# Patient Record
Sex: Male | Born: 2005 | Race: Black or African American | Hispanic: No | Marital: Single | Smoking: Never smoker
Health system: Southern US, Community
[De-identification: ages and names within clinical notes are randomized; demographics above are authoritative.]

## PROBLEM LIST (undated history)

## (undated) ENCOUNTER — Ambulatory Visit (HOSPITAL_COMMUNITY): Source: Home / Self Care

---
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
## 2023-07-15 ENCOUNTER — Ambulatory Visit (HOSPITAL_COMMUNITY)
Admission: RE | Admit: 2023-07-15 | Discharge: 2023-07-15 | Disposition: A | Discharge status: Home / Self Care | Payer: Medicaid Other | Source: Ambulatory Visit | Attending: Physician Assistant | Admitting: Physician Assistant

## 2023-07-15 ENCOUNTER — Encounter (HOSPITAL_COMMUNITY): Payer: Self-pay

## 2023-07-15 ENCOUNTER — Telehealth (HOSPITAL_COMMUNITY): Payer: Self-pay

## 2023-07-15 ENCOUNTER — Ambulatory Visit (INDEPENDENT_AMBULATORY_CARE_PROVIDER_SITE_OTHER): Payer: Medicaid Other

## 2023-07-15 VITALS — BP 140/73 | HR 58 | Temp 98.2°F

## 2023-07-15 DIAGNOSIS — R062 Wheezing: Secondary | ICD-10-CM

## 2023-07-15 DIAGNOSIS — J4521 Mild intermittent asthma with (acute) exacerbation: Secondary | ICD-10-CM

## 2023-07-15 MED ORDER — PREDNISONE 20 MG PO TABS: 40.0000 mg | ORAL_TABLET | Freq: Every day | ORAL | 0 refills | Status: AC

## 2023-07-15 MED ORDER — ALBUTEROL SULFATE HFA 108 (90 BASE) MCG/ACT IN AERS
2.0000 | INHALATION_SPRAY | Freq: Once | RESPIRATORY_TRACT | Status: AC
  Administered 2023-07-15: 2 via RESPIRATORY_TRACT

## 2023-07-15 MED ORDER — AEROCHAMBER PLUS FLO-VU LARGE MISC
1.0000 | Freq: Once | Status: AC
  Administered 2023-07-15: 1

## 2023-07-15 MED ORDER — AEROCHAMBER PLUS FLO-VU LARGE MISC
Status: AC
  Filled 2023-07-15: qty 1

## 2023-07-15 MED ORDER — ALBUTEROL SULFATE HFA 108 (90 BASE) MCG/ACT IN AERS
INHALATION_SPRAY | RESPIRATORY_TRACT | Status: AC
  Filled 2023-07-15: qty 6.7

## 2023-07-15 NOTE — Telephone Encounter (Signed)
Called and informed the Patient that the medication is now ready to be picked up.

## 2023-07-15 NOTE — ED Triage Notes (Signed)
Pt c/o wheezing and chest tightness x2wks. Denies vaping but states he smokes weed. Denies SOB

## 2023-07-15 NOTE — Discharge Instructions (Addendum)
Your x-ray was normal with no evidence of pneumonia or other abnormalities.  I am concerned that you might have some asthma and that is contributing to your symptoms.  Start prednisone 40 mg for 5 days.  Do not take NSAIDs with this medication including aspirin, ibuprofen/Advil, naproxen/Aleve.  You can use Tylenol/acetaminophen.  Take albuterol every 4-6 hours as needed.  Make sure you rest and drink plenty of fluid.  Please follow-up with your primary care.  If anything worsens and you have worsening cough, chest tightness, shortness of breath you need to be seen immediately.

## 2023-07-15 NOTE — ED Provider Notes (Signed)
[facility]    CSN: [id number] Arrival date & time: 07/15/23  1135      History   Chief Complaint Chief Complaint  Patient presents with   Wheezing    Chest hurts sometimes, always has take deep breath,fast heart beat - Entered by patient    HPI Alan Tran is a 17 y.o. male.   Patient presents today with a 2-week history of cough, chest tightness, shortness of breath.  Denies any chest pain, fever, nausea, vomiting, congestion.  Denies any known sick contacts.  He has had COVID approximately 1 year ago but denies any more recent illness.  He has not tried any over-the-counter medications for symptom management.  He does smoke and this does tend to trigger symptoms.  Denies formal diagnosis of asthma or COPD.  He has never used an inhaler.  Denies any recent antibiotics or steroids.  He has been managing his symptoms with deep breathing exercises but often will wake up at night because of the shortness of breath/cough.    History reviewed. No pertinent past medical history.  There are no problems to display for this patient.   History reviewed. No pertinent surgical history.     Home Medications    Prior to Admission medications   Medication Sig Start Date End Date Taking? Authorizing Provider  predniSONE (DELTASONE) 20 MG tablet Take 2 tablets (40 mg total) by mouth daily for 5 days. 07/15/23 07/20/23 Yes Elowen Debruyn, Noberto Retort, PA-C  cromolyn (OPTICROM) 4 % ophthalmic solution Place 2 drops into both eyes 4 (four) times daily. 04/12/17   Dorena Bodo, NP    Family History Family History  Problem Relation Age of Onset   Healthy Mother    Healthy Brother     Social History Social History   Tobacco Use   Smoking status: Never   Smokeless tobacco: Never  Vaping Use   Vaping status: Never Used  Substance Use Topics   Alcohol use: No     Allergies   Patient has no known allergies.   Review of Systems Review of Systems  Constitutional:  Positive  for activity change. Negative for appetite change, fatigue and fever.  HENT:  Negative for congestion, sinus pressure, sneezing and sore throat.   Respiratory:  Positive for cough, chest tightness and shortness of breath. Negative for wheezing.   Cardiovascular:  Negative for chest pain.  Gastrointestinal:  Negative for abdominal pain, diarrhea, nausea and vomiting.  Neurological:  Negative for dizziness, light-headedness and headaches.     Physical Exam Triage Vital Signs ED Triage Vitals  Encounter Vitals Group     BP 07/15/23 1217 (!) 140/73     Systolic BP Percentile --      Diastolic BP Percentile --      Pulse Rate 07/15/23 1217 (!) 58     Resp --      Temp 07/15/23 1217 98.2 F (36.8 C)     Temp Source 07/15/23 1217 Oral     SpO2 07/15/23 1217 96 %     Weight --      Height --      Head Circumference --      Peak Flow --      Pain Score 07/15/23 1218 0     Pain Loc --      Pain Education --      Exclude from Growth Chart --    No data found.  Updated Vital Signs BP (!) 140/73 (BP Location: Left Arm)  Pulse (!) 58   Temp 98.2 F (36.8 C) (Oral)   SpO2 96%   Visual Acuity Right Eye Distance:   Left Eye Distance:   Bilateral Distance:    Right Eye Near:   Left Eye Near:    Bilateral Near:     Physical Exam Vitals reviewed.  Constitutional:      General: He is awake.     Appearance: Normal appearance. He is well-developed. He is not ill-appearing.     Comments: Very pleasant male appears stated age in no acute distress sitting comfortably in exam room  HENT:     Head: Normocephalic and atraumatic.     Right Ear: Tympanic membrane, ear canal and external ear normal. Tympanic membrane is not erythematous or bulging.     Left Ear: Tympanic membrane, ear canal and external ear normal. Tympanic membrane is not erythematous or bulging.     Nose: Nose normal.     Mouth/Throat:     Pharynx: Uvula midline. No oropharyngeal exudate or posterior oropharyngeal  erythema.  Cardiovascular:     Rate and Rhythm: Normal rate and regular rhythm.     Heart sounds: Normal heart sounds, S1 normal and S2 normal. No murmur heard. Pulmonary:     Effort: Pulmonary effort is normal. No accessory muscle usage or respiratory distress.     Breath sounds: No stridor. Examination of the right-lower field reveals decreased breath sounds. Examination of the left-lower field reveals decreased breath sounds. Decreased breath sounds present. No wheezing, rhonchi or rales.  Neurological:     Mental Status: He is alert.  Psychiatric:        Behavior: Behavior is cooperative.      UC Treatments / Results  Labs (all labs ordered are listed, but only abnormal results are displayed) Labs Reviewed - No data to display  EKG   Radiology DG Chest 2 View  Result Date: 07/15/2023 CLINICAL DATA:  Cough and shortness of breath. EXAM: CHEST - 2 VIEW COMPARISON:  None Available. FINDINGS: The heart size and mediastinal contours are within normal limits. There is no evidence of pulmonary edema, consolidation, pneumothorax, nodule or pleural fluid. The visualized skeletal structures are unremarkable. IMPRESSION: No active cardiopulmonary disease. Electronically Signed   By: Irish Lack M.D.   On: 07/15/2023 13:21    Procedures Procedures (including critical care time)  Medications Ordered in UC Medications  albuterol (VENTOLIN HFA) 108 (90 Base) MCG/ACT inhaler 2 puff (2 puffs Inhalation Given 07/15/23 1303)  AeroChamber Plus Flo-Vu Large MISC 1 each (1 each Other Given 07/15/23 1304)    Initial Impression / Assessment and Plan / UC Course  I have reviewed the triage vital signs and the nursing notes.  Pertinent labs & imaging results that were available during my care of the patient were reviewed by me and considered in my medical decision making (see chart for details).     Patient is well-appearing, afebrile, nontoxic, nontachycardic.  Chest x-ray was obtained given  several week history of cough and shortness of breath that showed no acute cardiopulmonary disease.  Given his clinical presentation I was concerned for asthma.  He was given albuterol inhaler with significant improvement of symptoms in clinic.  He was sent home with albuterol and AeroChamber with instruction to use this every 4-6 hours as needed.  Will also start prednisone 40 mg for 5 days.  We discussed that this should not be taken with NSAIDs due to risk of GI bleeding.  Can use Tylenol as  needed.  He is to rest and drink plenty of fluid.  We discussed that if anything worsens or changes he needs to be seen immediately including worsening cough, shortness of breath, chest tightness/pain, wheezing despite use of bronchodilator medication.  Strict return precautions given.  Recommend follow-up with primary care.  Patient declined work excuse note.  Final Clinical Impressions(s) / UC Diagnoses   Final diagnoses:  Mild intermittent asthma with exacerbation  Wheezing     Discharge Instructions      Your x-ray was normal with no evidence of pneumonia or other abnormalities.  I am concerned that you might have some asthma and that is contributing to your symptoms.  Start prednisone 40 mg for 5 days.  Do not take NSAIDs with this medication including aspirin, ibuprofen/Advil, naproxen/Aleve.  You can use Tylenol/acetaminophen.  Take albuterol every 4-6 hours as needed.  Make sure you rest and drink plenty of fluid.  Please follow-up with your primary care.  If anything worsens and you have worsening cough, chest tightness, shortness of breath you need to be seen immediately.     ED Prescriptions     Medication Sig Dispense Auth. Provider   predniSONE (DELTASONE) 20 MG tablet Take 2 tablets (40 mg total) by mouth daily for 5 days. 10 tablet Zellie Jenning, Noberto Retort, PA-C      PDMP not reviewed this encounter.   Jeani Hawking, PA-C 07/15/23 1330

## 2023-07-15 NOTE — Telephone Encounter (Signed)
Patient calling I and states was seen today. Patient states went to the pharmacy and was informed the medication was not sent.   Called to speak with the pharmacy. They state the medication is ready.

## 2023-08-10 ENCOUNTER — Ambulatory Visit: Payer: Self-pay

## 2023-08-10 ENCOUNTER — Encounter (HOSPITAL_COMMUNITY): Payer: Self-pay

## 2023-08-10 ENCOUNTER — Ambulatory Visit (HOSPITAL_COMMUNITY)
Admission: EM | Admit: 2023-08-10 | Discharge: 2023-08-10 | Disposition: A | Discharge status: Home / Self Care | Payer: Medicaid Other | Attending: Emergency Medicine | Admitting: Emergency Medicine

## 2023-08-10 DIAGNOSIS — J029 Acute pharyngitis, unspecified: Secondary | ICD-10-CM | POA: Diagnosis not present

## 2023-08-10 LAB — POCT RAPID STREP A (OFFICE): Rapid Strep A Screen: NEGATIVE

## 2023-08-10 MED ORDER — IBUPROFEN 600 MG PO TABS: 600.0000 mg | ORAL_TABLET | Freq: Four times a day (QID) | ORAL | 0 refills | Status: DC | PRN

## 2023-08-10 MED ORDER — FLUTICASONE PROPIONATE 50 MCG/ACT NA SUSP: 2.0000 | Freq: Every day | NASAL | 0 refills | Status: DC

## 2023-08-10 MED ORDER — PENICILLIN V POTASSIUM 500 MG PO TABS: 500.0000 mg | ORAL_TABLET | Freq: Two times a day (BID) | ORAL | 0 refills | Status: AC

## 2023-08-10 NOTE — ED Provider Notes (Signed)
HPI  SUBJECTIVE:  Alan Tran is a 17 y.o. male who presents with a sore throat, headache, postnasal drip and belching starting last night.  No body aches, fevers, nasal congestion, rhinorrhea, cough, wheeze, nausea, vomiting, diarrhea, abdominal pain, rash.  No burning chest pain, waterbrash.  No drooling, trismus, voice changes, neck stiffness, sensation of throat swelling shut.  No allergy symptoms.  No known COVID, strep, mono exposure.  He did not get the COVID-vaccine.  No antipyretic in the past 6 hours.  No antibiotics in the past month.  He tried drinking cold water without improvement in his symptoms.  No alleviating factors, symptoms are worse with swallowing.  He states that he started a prednisone burst yesterday that was prescribed to him 3 weeks ago for an asthma exacerbation followed by a sore throat that evening.  He states that he has never taken prednisone before.  He has a past medical history of asthma and states that it is currently not bothering him.  No history of GERD.  PCP: None.  History reviewed. No pertinent past medical history.  History reviewed. No pertinent surgical history.  Family History  Problem Relation Age of Onset   Healthy Mother    Healthy Brother     Social History   Tobacco Use   Smoking status: Never   Smokeless tobacco: Never  Vaping Use   Vaping status: Former  Substance Use Topics   Alcohol use: No   Drug use: Yes    Types: Marijuana    No current facility-administered medications for this encounter.  Current Outpatient Medications:    fluticasone (FLONASE) 50 MCG/ACT nasal spray, Place 2 sprays into both nostrils daily., Disp: 16 g, Rfl: 0   ibuprofen (ADVIL) 600 MG tablet, Take 1 tablet (600 mg total) by mouth every 6 (six) hours as needed., Disp: 30 tablet, Rfl: 0   penicillin v potassium (VEETID) 500 MG tablet, Take 1 tablet (500 mg total) by mouth 2 (two) times daily for 10 days. X 10 days, Disp: 20 tablet, Rfl: 0  No Known  Allergies   ROS  As noted in HPI.   Physical Exam  BP 110/74 (BP Location: Right Arm)   Pulse 91   Temp 99 F (37.2 C) (Oral)   Resp 16   SpO2 96%   Constitutional: Well developed, well nourished, no acute distress Eyes:  EOMI, conjunctiva normal bilaterally HENT: Normocephalic, atraumatic,mucus membranes moist.  Nasal congestion.  Erythematous, swollen turbinates.  No maxillary, frontal sinus tenderness.  Erythematous, swollen tonsils with extensive exudates.  Uvula midline. Neck: Positive cervical lymphadenopathy Respiratory: Normal inspiratory effort, lungs clear bilaterally Cardiovascular: Normal rate, regular rhythm no murmurs GI: nondistended soft, nontender, no splenomegaly skin: No rash, skin intact Musculoskeletal: no deformities Neurologic: Alert & oriented x 3, no focal neuro deficits Psychiatric: Speech and behavior appropriate   ED Course   Medications - No data to display  Orders Placed This Encounter  Procedures   Culture, group A strep (throat)    Standing Status:   Standing    Number of Occurrences:   1   Nursing Communication Please set up with a PCP prior to discharge    Please set up with a PCP prior to discharge    Standing Status:   Standing    Number of Occurrences:   1   POC rapid strep A    Standing Status:   Standing    Number of Occurrences:   1    Results for  orders placed or performed during the hospital encounter of 08/10/23 (from the past 24 hour(s))  POC rapid strep A     Status: None   Collection Time: 08/10/23  3:13 PM  Result Value Ref Range   Rapid Strep A Screen Negative Negative   No results found.  ED Clinical Impression  1. Exudative pharyngitis      ED Assessment/Plan    Recent records reviewed.  As noted in HPI.  Rapid strep negative.  However, patient has an extensive exudative pharyngitis with swollen cervical lymphadenopathy and no cough..  Centor score is 3/4.  Sending off throat culture.  It is too early  to check for mono.  Will send home with Flonase, Tylenol/ibuprofen, Benadryl/Maalox mixture, penicillin.  Patient to discontinue the penicillin if strep culture is negative.  Advised patient to discontinue the prednisone.  Will set up with a PCP prior to discharge.  Discussed labs, MDM, treatment plan, and plan for follow-up with patient. Discussed sn/sx that should prompt return to the ED. patient agrees with plan.   Meds ordered this encounter  Medications   ibuprofen (ADVIL) 600 MG tablet    Sig: Take 1 tablet (600 mg total) by mouth every 6 (six) hours as needed.    Dispense:  30 tablet    Refill:  0   penicillin v potassium (VEETID) 500 MG tablet    Sig: Take 1 tablet (500 mg total) by mouth 2 (two) times daily for 10 days. X 10 days    Dispense:  20 tablet    Refill:  0   fluticasone (FLONASE) 50 MCG/ACT nasal spray    Sig: Place 2 sprays into both nostrils daily.    Dispense:  16 g    Refill:  0      *This clinic note was created using Scientist, clinical (histocompatibility and immunogenetics). Therefore, there may be occasional mistakes despite careful proofreading.  ?    Domenick Gong, MD 08/11/23 1155

## 2023-08-10 NOTE — Discharge Instructions (Signed)
your rapid strep was negative today, so we have sent off a throat culture.   Give [place] a working phone number.  Start the penicillin today.  If your culture is negative, then discontinue the antibiotics.  1 gram of Tylenol and 600 mg ibuprofen together 3-4 times a day as needed for pain.  Make sure you drink plenty of extra fluids.  Some people find salt water gargles and  Traditional Medicinal's "Throat Coat" tea helpful. Take 5 mL of liquid Benadryl and 5 mL of Maalox. Mix it together, and then hold it in your mouth for as long as you can and then swallow. You may do this 4 times a day.  Honey and lemon dissolved in hot water can also be soothing.  Flonase for the postnasal drip.  Go to www.goodrx.com  or www.costplusdrugs.com to look up your medications. This will give you a list of where you can find your prescriptions at the most affordable prices. Or ask the pharmacist what the cash price is, or if they have any other discount programs available to help make your medication more affordable. This can be less expensive than what you would pay with insurance.

## 2023-08-10 NOTE — ED Triage Notes (Signed)
Pt presents to UC w/ c/o sore throat since last night. Pt reports "burping a lot" starting "a couple days ago." Pt states he was taking prednisone for 2 doses for asthma and stopped due to the sore throat.

## 2023-08-10 NOTE — ED Notes (Signed)
PCP set up for patient. Appointment printed off and given to patient with discharge instructions.

## 2023-08-12 ENCOUNTER — Ambulatory Visit (HOSPITAL_BASED_OUTPATIENT_CLINIC_OR_DEPARTMENT_OTHER): Payer: Medicaid Other | Admitting: Family Medicine

## 2023-08-12 ENCOUNTER — Telehealth (HOSPITAL_COMMUNITY): Payer: Self-pay | Admitting: *Deleted

## 2023-08-12 LAB — CULTURE, GROUP A STREP (THRC)

## 2023-08-12 NOTE — Telephone Encounter (Signed)
Pt called about throat culture was neg, pt advised

## 2023-09-24 ENCOUNTER — Inpatient Hospital Stay (HOSPITAL_COMMUNITY)
Admission: RE | Admit: 2023-09-24 | Discharge: 2023-09-24 | Discharge status: Home / Self Care | Payer: Self-pay | Source: Ambulatory Visit | Attending: Internal Medicine | Admitting: Internal Medicine

## 2023-09-24 ENCOUNTER — Other Ambulatory Visit: Payer: Self-pay

## 2023-09-24 ENCOUNTER — Encounter (HOSPITAL_COMMUNITY): Payer: Self-pay

## 2023-09-24 VITALS — BP 125/75 | HR 64 | Temp 98.2°F | Resp 16

## 2023-09-24 DIAGNOSIS — M940 Chondrocostal junction syndrome [Tietze]: Secondary | ICD-10-CM

## 2023-09-24 MED ORDER — METHYLPREDNISOLONE SODIUM SUCC 125 MG IJ SOLR
INTRAMUSCULAR | Status: AC
  Filled 2023-09-24: qty 2

## 2023-09-24 MED ORDER — METHYLPREDNISOLONE SODIUM SUCC 125 MG IJ SOLR
80.0000 mg | Freq: Once | INTRAMUSCULAR | Status: AC
  Administered 2023-09-24: 80 mg via INTRAMUSCULAR

## 2023-09-24 MED ORDER — METHOCARBAMOL 500 MG PO TABS: 500.0000 mg | ORAL_TABLET | Freq: Two times a day (BID) | ORAL | 0 refills | Status: DC

## 2023-09-24 NOTE — ED Provider Notes (Signed)
[facility]    CSN: [id number] Arrival date & time: 09/24/23  1221      History   Chief Complaint Chief Complaint  Patient presents with   Wound Check    Feels like fluid near my heart mostly wen I lay down or lean back  and chest plus it's a tiny lil pain when I try to breathe in deeply or shortness of breath - Entered by patient    HPI Derryl BRINDEN KINCHELOE is a 17 y.o. male.   31 yr year old male who presents to urgent care with complaints of feeling like "fluid in my chest" especially with touching the area. Feels more obvious with breathing but not having trouble breathing. Denies fevers or chills. No injury that he knows of. The symptoms have been going on for over a week but he is unsure exactly how long. More noticeable this week. The symptoms are positional and worse with side laying.    Wound Check Associated symptoms include chest pain. Pertinent negatives include no abdominal pain and no shortness of breath.    History reviewed. No pertinent past medical history.  There are no problems to display for this patient.   History reviewed. No pertinent surgical history.     Home Medications    Prior to Admission medications   Medication Sig Start Date End Date Taking? Authorizing Provider  fluticasone (FLONASE) 50 MCG/ACT nasal spray Place 2 sprays into both nostrils daily. 08/10/23   Domenick Gong, MD  ibuprofen (ADVIL) 600 MG tablet Take 1 tablet (600 mg total) by mouth every 6 (six) hours as needed. 08/10/23   Domenick Gong, MD    Family History Family History  Problem Relation Age of Onset   Healthy Mother    Healthy Brother     Social History Social History   Tobacco Use   Smoking status: Never   Smokeless tobacco: Never  Vaping Use   Vaping status: Former  Substance Use Topics   Alcohol use: No   Drug use: Yes    Types: Marijuana     Allergies   Patient has no known allergies.   Review of Systems Review of Systems   Constitutional:  Negative for appetite change, chills and fever.  HENT:  Negative for congestion, ear pain and sore throat.   Eyes:  Negative for pain and visual disturbance.  Respiratory:  Negative for cough and shortness of breath.   Cardiovascular:  Positive for chest pain. Negative for palpitations.  Gastrointestinal:  Negative for abdominal pain and vomiting.  Genitourinary:  Negative for dysuria and hematuria.  Musculoskeletal:  Negative for arthralgias and back pain.  Skin:  Negative for color change and rash.  Neurological:  Negative for seizures and syncope.  All other systems reviewed and are negative.    Physical Exam Triage Vital Signs ED Triage Vitals [09/24/23 1233]  Encounter Vitals Group     BP 125/75     Systolic BP Percentile      Diastolic BP Percentile      Pulse Rate 64     Resp 16     Temp 98.2 F (36.8 C)     Temp Source Oral     SpO2 97 %     Weight      Height      Head Circumference      Peak Flow      Pain Score 0     Pain Loc      Pain Education  Exclude from Growth Chart    No data found.  Updated Vital Signs BP 125/75 (BP Location: Right Arm)   Pulse 64   Temp 98.2 F (36.8 C) (Oral)   Resp 16   SpO2 97%   Visual Acuity Right Eye Distance:   Left Eye Distance:   Bilateral Distance:    Right Eye Near:   Left Eye Near:    Bilateral Near:     Physical Exam Vitals and nursing note reviewed.  Constitutional:      General: He is not in acute distress.    Appearance: He is well-developed.  HENT:     Head: Normocephalic and atraumatic.  Eyes:     Conjunctiva/sclera: Conjunctivae normal.  Cardiovascular:     Rate and Rhythm: Normal rate and regular rhythm.     Pulses: Normal pulses.     Heart sounds: No murmur heard. Pulmonary:     Effort: Pulmonary effort is normal. No respiratory distress.     Breath sounds: Normal breath sounds.  Abdominal:     Palpations: Abdomen is soft.     Tenderness: There is no abdominal  tenderness.  Musculoskeletal:        General: No swelling.     Cervical back: Neck supple.  Skin:    General: Skin is warm and dry.     Capillary Refill: Capillary refill takes less than 2 seconds.  Neurological:     Mental Status: He is alert.  Psychiatric:        Mood and Affect: Mood normal.      UC Treatments / Results  Labs (all labs ordered are listed, but only abnormal results are displayed) Labs Reviewed - No data to display  EKG   Radiology No results found.  Procedures Procedures (including critical care time)  Medications Ordered in UC Medications - No data to display  Initial Impression / Assessment and Plan / UC Course  I have reviewed the triage vital signs and the nursing notes.  Pertinent labs & imaging results that were available during my care of the patient were reviewed by me and considered in my medical decision making (see chart for details).     Costochondritis  Symptoms most likely musculoskeletal. Steroid shot given today. This can cause some soreness at the sight of injection. May use heat or ice on the area for discomfort.  Robaxin 500mg  twice daily as needed for muscle spasms.  Return to urgent care or PCP if symptoms worsen or fail to resolve.   Final Clinical Impressions(s) / UC Diagnoses   Final diagnoses:  None   Discharge Instructions   None    ED Prescriptions   None    PDMP not reviewed this encounter.   Landis Martins, [place] 09/24/23 1306

## 2023-09-24 NOTE — ED Triage Notes (Signed)
"  I feel like is fluid on my chest when I am resting".

## 2023-09-24 NOTE — Discharge Instructions (Addendum)
Symptoms most likely musculoskeletal. Steroid shot given today. This can cause some soreness at the sight of injection. May use heat or ice on the area for discomfort.  Robaxin 500mg  twice daily as needed for muscle spasms.  Return to urgent care or PCP if symptoms worsen or fail to resolve.

## 2023-10-03 ENCOUNTER — Ambulatory Visit: Payer: Self-pay

## 2023-10-03 NOTE — Telephone Encounter (Signed)
    Chief Complaint: Chest pain that comes and goes, lasts a few minutes. Pain is stabbing. Has fast heart rate at times. States he has gone to UC several times, "but they don't do anything." Symptoms: Above Frequency: 3 weeks Pertinent Negatives: Patient denies chest pain now Disposition: [x] ED /[] Urgent Care (no appt availability in office) / [] Appointment(In office/virtual)/ []  [facility]/ [] Home Care/ [] Refused Recommended Disposition /[] [facility] [place] [facility]/ []  Follow-up with PCP Additional Notes: Agrees with ED.  Reason for Disposition  Patient sounds very sick or weak to the triager  Answer Assessment - Initial Assessment Questions 1. LOCATION: "Where does it hurt?"       Left side 2. RADIATION: "Does the pain go anywhere else?" (e.g., into neck, jaw, arms, back)     Back 3. ONSET: "When did the chest pain begin?" (Minutes, hours or days)      Months ago, 3 weeks 4. PATTERN: "Does the pain come and go, or has it been constant since it started?"  "Does it get worse with exertion?"      Comes and goes 5. DURATION: "How long does it last" (e.g., seconds, minutes, hours)     Minutes 6. SEVERITY: "How bad is the pain?"  (e.g., Scale 1-10; mild, moderate, or severe)    - MILD (1-3): doesn't interfere with normal activities     - MODERATE (4-7): interferes with normal activities or awakens from sleep    - SEVERE (8-10): excruciating pain, unable to do any normal activities       Sharp pain 7-8 7. CARDIAC RISK FACTORS: "Do you have any history of heart problems or risk factors for heart disease?" (e.g., angina, prior heart attack; diabetes, high blood pressure, high cholesterol, smoker, or strong family history of heart disease)     No 8. PULMONARY RISK FACTORS: "Do you have any history of lung disease?"  (e.g., blood clots in lung, asthma, emphysema, birth control pills)     No 9. CAUSE: "What do you think is causing the chest pain?"     Unsure 10. OTHER SYMPTOMS: "Do  you have any other symptoms?" (e.g., dizziness, nausea, vomiting, sweating, fever, difficulty breathing, cough)       Fast heart rate 11. PREGNANCY: "Is there any chance you are pregnant?" "When was your last menstrual period?"       N/a  Protocols used: Chest Pain-A-AH

## 2023-10-04 ENCOUNTER — Emergency Department (HOSPITAL_COMMUNITY): Payer: Medicaid Other

## 2023-10-04 ENCOUNTER — Encounter (HOSPITAL_COMMUNITY): Payer: Self-pay | Admitting: Emergency Medicine

## 2023-10-04 ENCOUNTER — Other Ambulatory Visit: Payer: Self-pay

## 2023-10-04 ENCOUNTER — Emergency Department (HOSPITAL_COMMUNITY)
Admission: EM | Admit: 2023-10-04 | Discharge: 2023-10-04 | Disposition: A | Discharge status: Home / Self Care | Payer: Medicaid Other

## 2023-10-04 DIAGNOSIS — R079 Chest pain, unspecified: Secondary | ICD-10-CM | POA: Insufficient documentation

## 2023-10-04 LAB — BASIC METABOLIC PANEL
Anion gap: 9 (ref 5–15)
BUN: 13 mg/dL (ref 6–20)
CO2: 27 mmol/L (ref 22–32)
Calcium: 9.8 mg/dL (ref 8.9–10.3)
Chloride: 104 mmol/L (ref 98–111)
Creatinine, Ser: 1 mg/dL (ref 0.61–1.24)
GFR, Estimated: >60 mL/min (ref >60)
Glucose, Bld: 73 mg/dL (ref 70–99)
Potassium: 3.8 mmol/L (ref 3.5–5.1)
Sodium: 140 mmol/L (ref 135–145)

## 2023-10-04 LAB — CBC
HCT: 41.6 % (ref 39.0–52.0)
Hemoglobin: 13.7 g/dL (ref 13.0–17.0)
MCH: 28.1 pg (ref 26.0–34.0)
MCHC: 32.9 g/dL (ref 30.0–36.0)
MCV: 85.4 fL (ref 80.0–100.0)
Platelets: 184 10*3/uL (ref 150–400)
RBC: 4.87 MIL/uL (ref 4.22–5.81)
RDW: 12.8 % (ref 11.5–15.5)
WBC: 8.4 10*3/uL (ref 4.0–10.5)
nRBC: 0 % (ref 0.0–0.2)

## 2023-10-04 LAB — TROPONIN I (HIGH SENSITIVITY)
Troponin I (High Sensitivity): <2 ng/L
Troponin I (High Sensitivity): <2 ng/L

## 2023-10-04 LAB — D-DIMER, QUANTITATIVE: D-Dimer, Quant: 0.29 ug{FEU}/mL (ref 0.00–0.50)

## 2023-10-04 NOTE — ED Provider Notes (Signed)
[place] EMERGENCY DEPARTMENT AT [facility] Provider Note   CSN: [id number] Arrival date & time: 10/04/23  6578     History  Chief Complaint  Patient presents with   Chest Pain    Alan Tran is a 17 y.o. male without any significant past medical history presenting to the emergency room with intermittent chest pain that has been ongoing for 3 weeks. This episode of chest pain started after he woke up, laying in bed, prior to arrival. Chest pain is located on left anterior chest wall, worse when he takes deep breath and worse with palpation.  Chest pain is better when he is laying on his left side or at rest.  Patient feels chest pain will radiate to his left shoulder. Reports associated feeling that his heart rate is very quick, denies associated SOB although written in triage note.  Denies any extremity edema, obvious swelling, chills, fever, abdominal pain nausea vomiting diarrhea, SOB, diaphoresis. Reports occasional marijuana use, denies recent smoking.  Denies drugs or alcohol. Denies past medical history or known heart disease in family. No recent illness. No recent travel.    Chest Pain      Home Medications Prior to Admission medications   Medication Sig Start Date End Date Taking? Authorizing Provider  fluticasone (FLONASE) 50 MCG/ACT nasal spray Place 2 sprays into both nostrils daily. 08/10/23   Domenick Gong, MD  ibuprofen (ADVIL) 600 MG tablet Take 1 tablet (600 mg total) by mouth every 6 (six) hours as needed. 08/10/23   Domenick Gong, MD  methocarbamol (ROBAXIN) 500 MG tablet Take 1 tablet (500 mg total) by mouth 2 (two) times daily. 09/24/23   Landis Martins, PA-C      Allergies    Patient has no known allergies.    Review of Systems   Review of Systems  Cardiovascular:  Positive for chest pain.    Physical Exam Updated Vital Signs BP 131/84 (BP Location: Right Arm)   Pulse 91   Temp 98.7 F (37.1 C) (Oral)   Resp (!) 24   Ht  5\' 5"  (1.651 m)   Wt 49.9 kg   SpO2 100%   BMI 18.30 kg/m  Physical Exam Vitals and nursing note reviewed.  Constitutional:      General: He is not in acute distress.    Appearance: He is not ill-appearing, toxic-appearing or diaphoretic.  HENT:     Head: Normocephalic and atraumatic.  Eyes:     General: No scleral icterus.    Conjunctiva/sclera: Conjunctivae normal.  Cardiovascular:     Rate and Rhythm: Normal rate and regular rhythm.     Pulses: Normal pulses.     Heart sounds: Normal heart sounds. No murmur heard. Pulmonary:     Effort: Pulmonary effort is normal. No respiratory distress.     Breath sounds: Normal breath sounds. No stridor. No wheezing, rhonchi or rales.     Comments: Mild tenderness to palpation over left anterior chest wall Chest:     Chest wall: Tenderness present.  Abdominal:     General: Abdomen is flat. Bowel sounds are normal.     Palpations: Abdomen is soft.     Tenderness: There is no abdominal tenderness.  Musculoskeletal:     Cervical back: No tenderness.     Right lower leg: No edema.     Left lower leg: No edema.  Skin:    General: Skin is warm and dry.     Capillary Refill: Capillary refill  takes less than 2 seconds.     Findings: No lesion.  Neurological:     General: No focal deficit present.     Mental Status: He is alert and oriented to person, place, and time. Mental status is at baseline.     Cranial Nerves: No cranial nerve deficit.     Sensory: No sensory deficit.     Motor: No weakness.     ED Results / Procedures / Treatments   Labs (all labs ordered are listed, but only abnormal results are displayed) Labs Reviewed - No data to display  EKG None  Radiology No results found.  Procedures Procedures    Medications Ordered in ED Medications - No data to display  ED Course/ Medical Decision Making/ A&P                                 Medical Decision Making Amount and/or Complexity of Data Reviewed Labs:  ordered. Radiology: ordered.   Alan Tran 17 y.o. presented today for chest pain. Working DDx that I considered at this time includes, but not limited to, ACS, GERD, pe, pna, aortic dissection, pneumothorax, MSK path, anemia, esophageal rupture, CHF exacerbation, valvular disorder, myocarditis, pericarditis, endocarditis, pericardial effusion/cardiac tamponade, pulmonary edema, gastritis/PUD, esophagitis.  R/o Dx: These are considered less likely due to history of present illness and physical exam findings. Obtaining Ddimer to rule out DVT/PE, if positive will obtain CTA chest.  Aortic Dissection: less likely based on the history of present illness - location, quality, onset, and severity of symptoms in this case.   Review of prior external notes: 09/24/2023  Unique Tests and My Interpretation:  EKG: Rate, rhythm, axis, intervals all examined: Sinus tachycardia, rate 100, right atrial enlargement Troponin: Neg, repeat pending  CXR: no active cardiopulmonary disease  CBC: wnl  BMP: wnl Ddimmer  NEG  Problem List / ED Course / Critical interventions / Medication management  Patient presenting to emergency room with 3 weeks of chest pain that is been intermittent.  This episode of chest pain started this morning, and by the time patient arrived to emergency room patient had significantly decreased.  Patient reports no associated symptoms with this chest pain.  Patient was given steroid shot previously at urgent care, prescribed Robaxin but has not tried.  Working up for chest pain including 2 troponins, chest x-ray and D-dimer.  EKG, imaging and labs overall unremarkable.  Patient reports his pain is gone away.  He has not experienced any more chest pain or palpitations during his stay.  Vitals have been stable throughout stay. Given reassuring workup, will have patient follow up with primary care and disused heart monitor for feeling over increased HR.   I ordered medication including: none   Reevaluation of the patient showed that the patient improved Patients vitals assessed. Upon arrival patient is hemodynamically stable.  I have reviewed the patients home medicines and have made adjustments as needed    Plan: F/u w/ PCP to ensure resolution of sx.  Patient was given return precautions. Patient stable for discharge at this time.  Patient educated on sx/ dx and verbalized understanding of plan.  Will return to ER w/ new or worsening sx.          Final Clinical Impression(s) / ED Diagnoses Final diagnoses:  None    Rx / DC Orders ED Discharge Orders     None  Smitty Knudsen, PA-C 10/04/23 1458    Durwin Glaze, MD 10/04/23 [phone]

## 2023-10-04 NOTE — Discharge Instructions (Signed)
You were seen in the emergency room today for chest pain.  Overall, your lab work and imaging came back unremarkable.  I would recommend taking ibuprofen and Tylenol for pain control.  If you have not already tried the Robaxin, you can take this as well.  Since you are having a fast heart rate, I would recommend following with primary care and considering a Holter monitor and follow up on todays visit.  If you do not have a primary care doctor please call [place] [facility] which is attached to your discharge paperwork.  Please return to the emergency room if you have any new or worsening symptoms.

## 2023-10-04 NOTE — ED Triage Notes (Addendum)
Pt came in POV for CP.  Pt states he feels like he has been having fast HR and fluid around his heart.  He has no dx, just a feeling. Pt seen on 10/5 for the same.  States CP is 7/10, with some SOB.  No NV.  PT states he hasn't been taking previously prescribed Robaxin b/c he doesn't think it is his muscles.

## 2023-11-10 ENCOUNTER — Encounter (HOSPITAL_BASED_OUTPATIENT_CLINIC_OR_DEPARTMENT_OTHER): Payer: Self-pay | Admitting: Family Medicine

## 2023-12-09 ENCOUNTER — Ambulatory Visit (INDEPENDENT_AMBULATORY_CARE_PROVIDER_SITE_OTHER): Payer: Medicaid Other | Admitting: Family Medicine

## 2023-12-09 ENCOUNTER — Encounter: Payer: Self-pay | Admitting: Family Medicine

## 2023-12-09 VITALS — BP 108/66 | HR 61 | Temp 98.1°F | Resp 16 | Ht 67.0 in | Wt 110.4 lb

## 2023-12-09 DIAGNOSIS — Z130 Encounter for screening for diseases of the blood and blood-forming organs and certain disorders involving the immune mechanism: Secondary | ICD-10-CM

## 2023-12-09 DIAGNOSIS — Z0000 Encounter for general adult medical examination without abnormal findings: Secondary | ICD-10-CM | POA: Diagnosis not present

## 2023-12-09 DIAGNOSIS — Z114 Encounter for screening for human immunodeficiency virus [HIV]: Secondary | ICD-10-CM

## 2023-12-09 DIAGNOSIS — Z13220 Encounter for screening for lipoid disorders: Secondary | ICD-10-CM

## 2023-12-09 DIAGNOSIS — Z1159 Encounter for screening for other viral diseases: Secondary | ICD-10-CM

## 2023-12-09 DIAGNOSIS — Z7689 Persons encountering health services in other specified circumstances: Secondary | ICD-10-CM

## 2023-12-09 NOTE — Progress Notes (Unsigned)
Patient is here to established care with provider. ~health hx address ~care gaps address  

## 2023-12-10 LAB — CBC WITH DIFFERENTIAL/PLATELET
Basophils Absolute: 0.1 10*3/uL (ref 0.0–0.2)
Basos: 1 %
EOS (ABSOLUTE): 0 10*3/uL (ref 0.0–0.4)
Eos: 0 %
Hematocrit: 45.4 % (ref 37.5–51.0)
Hemoglobin: 14.5 g/dL (ref 13.0–17.7)
Immature Grans (Abs): 0 10*3/uL (ref 0.0–0.1)
Immature Granulocytes: 0 %
Lymphocytes Absolute: 2.1 10*3/uL (ref 0.7–3.1)
Lymphs: 29 %
MCH: 28.4 pg (ref 26.6–33.0)
MCHC: 31.9 g/dL (ref 31.5–35.7)
MCV: 89 fL (ref 79–97)
Monocytes Absolute: 0.7 10*3/uL (ref 0.1–0.9)
Monocytes: 9 %
Neutrophils Absolute: 4.5 10*3/uL (ref 1.4–7.0)
Neutrophils: 61 %
Platelets: 225 10*3/uL (ref 150–450)
RBC: 5.11 x10E6/uL (ref 4.14–5.80)
RDW: 11.8 % (ref 11.6–15.4)
WBC: 7.3 10*3/uL (ref 3.4–10.8)

## 2023-12-10 LAB — CMP14+EGFR
ALT: 16 [IU]/L (ref 0–44)
AST: 19 [IU]/L (ref 0–40)
Albumin: 4.5 g/dL (ref 4.3–5.2)
Alkaline Phosphatase: 100 [IU]/L (ref 51–125)
BUN/Creatinine Ratio: 15 (ref 9–20)
BUN: 13 mg/dL (ref 6–20)
Bilirubin Total: 0.4 mg/dL (ref 0.0–1.2)
CO2: 25 mmol/L (ref 20–29)
Calcium: 9.6 mg/dL (ref 8.7–10.2)
Chloride: 102 mmol/L (ref 96–106)
Creatinine, Ser: 0.86 mg/dL (ref 0.76–1.27)
Globulin, Total: 2.7 g/dL (ref 1.5–4.5)
Glucose: 77 mg/dL (ref 70–99)
Potassium: 4.1 mmol/L (ref 3.5–5.2)
Sodium: 142 mmol/L (ref 134–144)
Total Protein: 7.2 g/dL (ref 6.0–8.5)
eGFR: 128 mL/min/{1.73_m2} (ref >59)

## 2023-12-10 LAB — LIPID PANEL
Chol/HDL Ratio: 3 {ratio} (ref 0.0–5.0)
Cholesterol, Total: 135 mg/dL (ref 100–169)
HDL: 45 mg/dL (ref >39)
LDL Chol Calc (NIH): 81 mg/dL (ref 0–109)
Triglycerides: 38 mg/dL (ref 0–89)
VLDL Cholesterol Cal: 9 mg/dL (ref 5–40)

## 2023-12-10 LAB — HIV ANTIBODY (ROUTINE TESTING W REFLEX): HIV Screen 4th Generation wRfx: NONREACTIVE

## 2023-12-10 LAB — HEPATITIS C ANTIBODY: Hep C Virus Ab: NONREACTIVE

## 2023-12-12 ENCOUNTER — Encounter: Payer: Self-pay | Admitting: Family Medicine

## 2023-12-12 NOTE — Progress Notes (Signed)
New Patient Office Visit  Subjective    Patient ID: Alan Tran, male    DOB: Nov 11, 2006  Age: 17 y.o. MRN: [id number]  CC:  Chief Complaint  Patient presents with   Establish Care    HPI Alan Tran presents to establish care and for routine annual exam. Patient denies any chronic med issues and taking meds on a regular basis. Patient denies acute complaints.    Outpatient Encounter Medications as of 12/09/2023  Medication Sig   chlorhexidine (PERIDEX) 0.12 % solution SMARTSIG:By Mouth (Patient not taking: Reported on 12/09/2023)   fluticasone (FLONASE) 50 MCG/ACT nasal spray Place 2 sprays into both nostrils daily. (Patient not taking: Reported on 12/09/2023)   ibuprofen (ADVIL) 600 MG tablet Take 1 tablet (600 mg total) by mouth every 6 (six) hours as needed. (Patient not taking: Reported on 12/09/2023)   ibuprofen (ADVIL) 800 MG tablet Take 800 mg by mouth every 6 (six) hours as needed. (Patient not taking: Reported on 12/09/2023)   methocarbamol (ROBAXIN) 500 MG tablet Take 1 tablet (500 mg total) by mouth 2 (two) times daily. (Patient not taking: Reported on 12/09/2023)   methylPREDNISolone (MEDROL DOSEPAK) 4 MG TBPK tablet TAKE 6 TABLETS ON DAY 1 AS DIRECTED ON PACKAGE AND DECREASE BY 1 TAB EACH DAY FOR A TOTAL OF 6 DAYS (Patient not taking: Reported on 12/09/2023)   penicillin v potassium (VEETID) 500 MG tablet Take 500 mg by mouth 4 (four) times daily. (Patient not taking: Reported on 12/09/2023)   No facility-administered encounter medications on file as of 12/09/2023.    History reviewed. No pertinent past medical history.  History reviewed. No pertinent surgical history.  Family History  Problem Relation Age of Onset   Healthy Mother    Healthy Brother     Social History   Socioeconomic History   Marital status: Single    Spouse name: Not on file   Number of children: Not on file   Years of education: Not on file   Highest education level: 12th grade   Occupational History   Not on file  Tobacco Use   Smoking status: Never   Smokeless tobacco: Never  Vaping Use   Vaping status: Former  Substance and Sexual Activity   Alcohol use: No   Drug use: Yes    Types: Marijuana   Sexual activity: Not on file  Other Topics Concern   Not on file  Social History Narrative   Not on file   Social Drivers of Health   Financial Resource Strain: Low Risk  (12/08/2023)   Overall Financial Resource Strain (CARDIA)    Difficulty of Paying Living Expenses: Not hard at all  Food Insecurity: Patient Declined (12/08/2023)   Hunger Vital Sign    Worried About Running Out of Food in the Last Year: Patient declined    Ran Out of Food in the Last Year: Patient declined  Transportation Needs: No Transportation Needs (12/08/2023)   PRAPARE - Administrator, Civil Service (Medical): No    Lack of Transportation (Non-Medical): No  Physical Activity: Insufficiently Active (12/08/2023)   Exercise Vital Sign    Days of Exercise per Week: 2 days    Minutes of Exercise per Session: 20 min  Stress: No Stress Concern Present (12/08/2023)   [name]    Feeling of Stress : Only a little  Social Connections: Unknown (12/08/2023)   Social Connection and Isolation Panel [NHANES]  Frequency of Communication with Friends and Family: More than three times a week    Frequency of Social Gatherings with Friends and Family: Three times a week    Attends Religious Services: 1 to 4 times per year    Active Member of Clubs or Organizations: No    Attends Banker Meetings: Not on file    Marital Status: Patient declined  Intimate Partner Violence: Not on file    Review of Systems  All other systems reviewed and are negative.       Objective   BP 108/66   Pulse 61   Temp 98.1 F (36.7 C) (Oral)   Resp 16   Ht 5\' 7"  (1.702 m)   Wt 110 lb 6.4 oz (50.1 kg)   SpO2  99%   BMI 17.29 kg/m   Physical Exam Vitals and nursing note reviewed.  Constitutional:      General: He is not in acute distress. HENT:     Head: Normocephalic and atraumatic.     Right Ear: Tympanic membrane, ear canal and external ear normal.     Left Ear: Tympanic membrane, ear canal and external ear normal.     Nose: Nose normal.     Mouth/Throat:     Mouth: Mucous membranes are moist.     Pharynx: Oropharynx is clear.  Eyes:     Conjunctiva/sclera: Conjunctivae normal.     Pupils: Pupils are equal, round, and reactive to light.  Neck:     Thyroid: No thyromegaly.  Cardiovascular:     Rate and Rhythm: Normal rate and regular rhythm.     Heart sounds: Normal heart sounds. No murmur heard. Pulmonary:     Effort: Pulmonary effort is normal.     Breath sounds: Normal breath sounds.  Abdominal:     General: There is no distension.     Palpations: Abdomen is soft. There is no mass.     Tenderness: There is no abdominal tenderness.     Hernia: There is no hernia in the left inguinal area or right inguinal area.  Genitourinary:    Penis: Normal and circumcised.      Testes: Normal.  Musculoskeletal:        General: Normal range of motion.     Cervical back: Normal range of motion and neck supple.     Right lower leg: No edema.     Left lower leg: No edema.  Skin:    General: Skin is warm and dry.  Neurological:     General: No focal deficit present.     Mental Status: He is alert and oriented to person, place, and time. Mental status is at baseline.  Psychiatric:        Mood and Affect: Mood normal.        Behavior: Behavior normal.         Assessment & Plan:   Annual physical exam -     CMP14+EGFR  Screening for deficiency anemia -     CBC with Differential/Platelet  Screening for lipid disorders -     Lipid panel  Screening for HIV (human immunodeficiency virus) -     HIV Antibody (routine testing w rflx)  Need for hepatitis C screening test -      Hepatitis C antibody  Encounter to establish care     Return in about 1 year (around 12/08/2024) for physical.   Alan Raymond, MD

## 2023-12-14 ENCOUNTER — Encounter: Payer: Self-pay | Admitting: Family Medicine

## 2023-12-16 NOTE — Telephone Encounter (Signed)
Patient wanted to be seen nect week patient scheduled with other provider who will be in office next week. Let patient know if pain gets worse to go to ER

## 2023-12-22 ENCOUNTER — Ambulatory Visit: Payer: Medicaid Other

## 2023-12-22 ENCOUNTER — Encounter: Payer: Self-pay | Admitting: Family

## 2023-12-22 ENCOUNTER — Ambulatory Visit (INDEPENDENT_AMBULATORY_CARE_PROVIDER_SITE_OTHER): Payer: Medicaid Other | Admitting: Family

## 2023-12-22 VITALS — BP 107/69 | HR 70 | Temp 98.3°F | Ht 67.0 in | Wt 109.2 lb

## 2023-12-22 DIAGNOSIS — R079 Chest pain, unspecified: Secondary | ICD-10-CM | POA: Diagnosis not present

## 2023-12-22 MED ORDER — OMEPRAZOLE 20 MG PO CPDR
20.0000 mg | DELAYED_RELEASE_CAPSULE | Freq: Every day | ORAL | 0 refills | Status: AC
Start: 2023-12-22 — End: 2024-03-21

## 2023-12-22 NOTE — Progress Notes (Signed)
 Patient states no other concerns to discuss.

## 2023-12-22 NOTE — Progress Notes (Signed)
 Patient ID: Alan Tran, male    DOB: 2006/07/14  MRN: [id number]  CC: Chest Pain  Subjective: Alan Tran is a 18 y.o. male who presents for chest pain.   His concerns today include:  Reports no active chest pain today in office. Reports chest pain comes and goes located at left chest. Denies additional symptoms. States he does smoke marijuana. No further issues/concerns for discussion today.   Patient Active Problem List   Diagnosis Date Noted   Acute pain of right knee 10/13/2016   Encounter for well child check without abnormal findings 01/30/2016     Current Outpatient Medications on File Prior to Visit  Medication Sig Dispense Refill   chlorhexidine (PERIDEX) 0.12 % solution SMARTSIG:By Mouth (Patient not taking: Reported on 12/22/2023)     fluticasone  (FLONASE ) 50 MCG/ACT nasal spray Place 2 sprays into both nostrils daily. (Patient not taking: Reported on 12/22/2023) 16 g 0   ibuprofen  (ADVIL ) 600 MG tablet Take 1 tablet (600 mg total) by mouth every 6 (six) hours as needed. (Patient not taking: Reported on 12/22/2023) 30 tablet 0   ibuprofen  (ADVIL ) 800 MG tablet Take 800 mg by mouth every 6 (six) hours as needed. (Patient not taking: Reported on 12/22/2023)     methocarbamol  (ROBAXIN ) 500 MG tablet Take 1 tablet (500 mg total) by mouth 2 (two) times daily. (Patient not taking: Reported on 12/22/2023) 20 tablet 0   methylPREDNISolone  (MEDROL  DOSEPAK) 4 MG TBPK tablet TAKE 6 TABLETS ON DAY 1 AS DIRECTED ON PACKAGE AND DECREASE BY 1 TAB EACH DAY FOR A TOTAL OF 6 DAYS (Patient not taking: Reported on 12/22/2023)     penicillin  v potassium (VEETID) 500 MG tablet Take 500 mg by mouth 4 (four) times daily. (Patient not taking: Reported on 12/22/2023)     No current facility-administered medications on file prior to visit.    No Known Allergies  Social History   Socioeconomic History   Marital status: Single    Spouse name: Not on file   Number of children: Not on file   Years of  education: Not on file   Highest education level: 12th grade  Occupational History   Not on file  Tobacco Use   Smoking status: Never   Smokeless tobacco: Never  Vaping Use   Vaping status: Former  Substance and Sexual Activity   Alcohol use: No   Drug use: Yes    Types: Marijuana   Sexual activity: Not on file  Other Topics Concern   Not on file  Social History Narrative   Not on file   Social Drivers of Health   Financial Resource Strain: Low Risk  (12/08/2023)   Overall Financial Resource Strain (CARDIA)    Difficulty of Paying Living Expenses: Not hard at all  Food Insecurity: Patient Declined (12/08/2023)   Hunger Vital Sign    Worried About Running Out of Food in the Last Year: Patient declined    Ran Out of Food in the Last Year: Patient declined  Transportation Needs: No Transportation Needs (12/08/2023)   PRAPARE - Administrator, Civil Service (Medical): No    Lack of Transportation (Non-Medical): No  Physical Activity: Insufficiently Active (12/08/2023)   Exercise Vital Sign    Days of Exercise per Week: 2 days    Minutes of Exercise per Session: 20 min  Stress: No Stress Concern Present (12/08/2023)   [name]    Feeling  of Stress : Only a little  Social Connections: Unknown (12/08/2023)   Social Connection and Isolation Panel [NHANES]    Frequency of Communication with Friends and Family: More than three times a week    Frequency of Social Gatherings with Friends and Family: Three times a week    Attends Religious Services: 1 to 4 times per year    Active Member of Clubs or Organizations: No    Attends Engineer, Structural: Not on file    Marital Status: Patient declined  Intimate Partner Violence: Not on file    Family History  Problem Relation Age of Onset   Healthy Mother    Healthy Brother     No past surgical history on file.  ROS: Review of  Systems Negative except as stated above  PHYSICAL EXAM: BP 107/69   Pulse 70   Temp 98.3 F (36.8 C) (Oral)   Ht 5' 7 (1.702 m)   Wt 109 lb 3.2 oz (49.5 kg)   SpO2 97%   BMI 17.10 kg/m   Physical Exam HENT:     Head: Normocephalic and atraumatic.     Nose: Nose normal.     Mouth/Throat:     Mouth: Mucous membranes are moist.     Pharynx: Oropharynx is clear.  Eyes:     Extraocular Movements: Extraocular movements intact.     Conjunctiva/sclera: Conjunctivae normal.     Pupils: Pupils are equal, round, and reactive to light.  Cardiovascular:     Rate and Rhythm: Normal rate and regular rhythm.     Pulses: Normal pulses.     Heart sounds: Normal heart sounds.  Pulmonary:     Effort: Pulmonary effort is normal.     Breath sounds: Normal breath sounds.  Musculoskeletal:        General: Normal range of motion.     Cervical back: Normal range of motion and neck supple.  Neurological:     General: No focal deficit present.     Mental Status: He is alert and oriented to person, place, and time.  Psychiatric:        Mood and Affect: Mood normal.        Behavior: Behavior normal.     ASSESSMENT AND PLAN: 1. Chest pain, unspecified type (Primary) - Patient today in office with no cardiopulmonary/acute distress.  - EKG for further evaluation. - Diagnostic chest xray for further evaluation.  - Routine screening.  - Omeprazole  as prescribed. Counseled on medication adherence/adverse effects.  - Referral to Cardiology for further evaluation/management.  - Follow-up with primary provider as scheduled.  - EKG 12-Lead - DG Chest 2 View; Future - Basic Metabolic Panel - CBC - Ambulatory referral to Cardiology - omeprazole  (PRILOSEC) 20 MG capsule; Take 1 capsule (20 mg total) by mouth daily.  Dispense: 90 capsule; Refill: 0   Patient was given the opportunity to ask questions.  Patient verbalized understanding of the plan and was able to repeat key elements of the plan.  Patient was given clear instructions to go to Emergency Department or return to medical center if symptoms don't improve, worsen, or new problems develop.The patient verbalized understanding.   Orders Placed This Encounter  Procedures   DG Chest 2 View   Basic Metabolic Panel   CBC   Ambulatory referral to Cardiology   EKG 12-Lead     Requested Prescriptions   Signed Prescriptions Disp Refills   omeprazole  (PRILOSEC) 20 MG capsule 90 capsule 0  Sig: Take 1 capsule (20 mg total) by mouth daily.    Return in about 1 week (around 12/29/2023) for Follow-Up or next available with Raguel Blush, MD .  Greig JINNY Drones, NP

## 2023-12-23 LAB — CBC
Hematocrit: 45.7 % (ref 37.5–51.0)
Hemoglobin: 14.7 g/dL (ref 13.0–17.7)
MCH: 28.3 pg (ref 26.6–33.0)
MCHC: 32.2 g/dL (ref 31.5–35.7)
MCV: 88 fL (ref 79–97)
Platelets: 210 10*3/uL (ref 150–450)
RBC: 5.19 x10E6/uL (ref 4.14–5.80)
RDW: 11.9 % (ref 11.6–15.4)
WBC: 4.9 10*3/uL (ref 3.4–10.8)

## 2023-12-23 LAB — BASIC METABOLIC PANEL
BUN/Creatinine Ratio: 10 (ref 9–20)
BUN: 10 mg/dL (ref 6–20)
CO2: 26 mmol/L (ref 20–29)
Calcium: 9.7 mg/dL (ref 8.7–10.2)
Chloride: 102 mmol/L (ref 96–106)
Creatinine, Ser: 1.01 mg/dL (ref 0.76–1.27)
Glucose: 95 mg/dL (ref 70–99)
Potassium: 4.6 mmol/L (ref 3.5–5.2)
Sodium: 142 mmol/L (ref 134–144)
eGFR: 110 mL/min/{1.73_m2} (ref >59)

## 2023-12-28 ENCOUNTER — Ambulatory Visit: Payer: Medicaid Other | Admitting: Family Medicine

## 2024-01-24 ENCOUNTER — Telehealth: Payer: Self-pay | Admitting: Family Medicine

## 2024-01-24 ENCOUNTER — Ambulatory Visit: Payer: Medicaid Other | Admitting: Family Medicine

## 2024-01-24 NOTE — Telephone Encounter (Signed)
Called pt and left message with pt contact to have pt call back to reschedule missed appt

## 2024-02-09 ENCOUNTER — Telehealth: Payer: Self-pay | Admitting: Family Medicine

## 2024-02-09 ENCOUNTER — Ambulatory Visit: Payer: Medicaid Other | Admitting: Family Medicine

## 2024-02-09 NOTE — Telephone Encounter (Signed)
Called pt and left message to call office back to reschedule missed appt

## 2024-04-06 ENCOUNTER — Ambulatory Visit: Payer: Medicaid Other | Admitting: Cardiovascular Disease

## 2024-04-16 ENCOUNTER — Encounter (HOSPITAL_COMMUNITY): Payer: Self-pay

## 2024-04-16 ENCOUNTER — Ambulatory Visit (HOSPITAL_COMMUNITY)
Admission: RE | Admit: 2024-04-16 | Discharge: 2024-04-16 | Disposition: A | Discharge status: Home / Self Care | Payer: Self-pay | Source: Ambulatory Visit | Attending: Emergency Medicine | Admitting: Emergency Medicine

## 2024-04-16 VITALS — BP 106/66 | HR 78 | Temp 98.9°F | Resp 16

## 2024-04-16 DIAGNOSIS — Z113 Encounter for screening for infections with a predominantly sexual mode of transmission: Secondary | ICD-10-CM | POA: Diagnosis present

## 2024-04-16 NOTE — ED Triage Notes (Signed)
 Pt states he would like STI testing he would like only cyto today.

## 2024-04-16 NOTE — ED Provider Notes (Signed)
 [facility]    CSN: [id number] Arrival date & time: 04/16/24  1253      History   Chief Complaint Chief Complaint  Patient presents with   SEXUALLY TRANSMITTED DISEASE    Entered by patient    HPI Alan Tran is a 18 y.o. male.  Presents for STD testing Not having any symptoms Denies known exposures  Had HIV testing 4 months ago Declines RPR today  History reviewed. No pertinent past medical history.  Patient Active Problem List   Diagnosis Date Noted   Acute pain of right knee 10/13/2016   Encounter for well child check without abnormal findings 01/30/2016    History reviewed. No pertinent surgical history.     Home Medications    Prior to Admission medications   Medication Sig Start Date End Date Taking? Authorizing Provider  omeprazole  (PRILOSEC) 20 MG capsule Take 1 capsule (20 mg total) by mouth daily. 12/22/23 03/21/24  Senaida Dama, NP    Family History Family History  Problem Relation Age of Onset   Healthy Mother    Healthy Brother     Social History Social History   Tobacco Use   Smoking status: Never   Smokeless tobacco: Never  Vaping Use   Vaping status: Former  Substance Use Topics   Alcohol use: No   Drug use: Yes    Types: Marijuana     Allergies   Patient has no known allergies.   Review of Systems Review of Systems Per HPI  Physical Exam Triage Vital Signs ED Triage Vitals  Encounter Vitals Group     BP      Systolic BP Percentile      Diastolic BP Percentile      Pulse      Resp      Temp      Temp src      SpO2      Weight      Height      Head Circumference      Peak Flow      Pain Score      Pain Loc      Pain Education      Exclude from Growth Chart    No data found.  Updated Vital Signs BP 106/66 (BP Location: Right Arm)   Pulse 78   Temp 98.9 F (37.2 C) (Oral)   Resp 16   SpO2 97%   Physical Exam Vitals and nursing note reviewed.  Constitutional:      General: He is not  in acute distress.    Appearance: Normal appearance.  Cardiovascular:     Rate and Rhythm: Normal rate and regular rhythm.     Heart sounds: Normal heart sounds.  Pulmonary:     Effort: Pulmonary effort is normal.     Breath sounds: Normal breath sounds.  Neurological:     Mental Status: He is alert and oriented to person, place, and time.     UC Treatments / Results  Labs (all labs ordered are listed, but only abnormal results are displayed) Labs Reviewed  CYTOLOGY, (ORAL, ANAL, URETHRAL) ANCILLARY ONLY    EKG   Radiology No results found.  Procedures Procedures (including critical care time)  Medications Ordered in UC Medications - No data to display  Initial Impression / Assessment and Plan / UC Course  I have reviewed the triage vital signs and the nursing notes.  Pertinent labs & imaging results that were available during my  care of the patient were reviewed by me and considered in my medical decision making (see chart for details).  Cytology swab pending Treat positive result as indicated Safe sex practices No questions at this time  Final Clinical Impressions(s) / UC Diagnoses   Final diagnoses:  Screen for STD (sexually transmitted disease)     Discharge Instructions      We will call you if anything on your swab returns positive. You can also see these results on MyChart. Please abstain from sexual intercourse until your results return.     ED Prescriptions   None    PDMP not reviewed this encounter.   Creighton Doffing, [place] 04/16/24 1333

## 2024-04-16 NOTE — Discharge Instructions (Addendum)
 We will call you if anything on your swab returns positive. You can also see these results on MyChart. Please abstain from sexual intercourse until your results return.

## 2024-04-17 LAB — CYTOLOGY, (ORAL, ANAL, URETHRAL) ANCILLARY ONLY
Chlamydia: POSITIVE — AB
Comment: NEGATIVE
Comment: NEGATIVE
Comment: NORMAL
Neisseria Gonorrhea: NEGATIVE
Trichomonas: NEGATIVE

## 2024-04-18 ENCOUNTER — Telehealth: Payer: Self-pay

## 2024-04-18 MED ORDER — DOXYCYCLINE HYCLATE 100 MG PO TABS
100.0000 mg | ORAL_TABLET | Freq: Two times a day (BID) | ORAL | 0 refills | Status: AC
Start: 2024-04-18 — End: 2024-04-25

## 2024-04-18 NOTE — Telephone Encounter (Signed)
 Per protocol, pt requires tx with Doxycycline.  Reviewed with patient, verified pharmacy, prescription sent.

## 2024-04-24 ENCOUNTER — Encounter: Payer: Self-pay | Admitting: Family Medicine

## 2024-04-24 ENCOUNTER — Ambulatory Visit: Admitting: Family Medicine

## 2024-04-24 VITALS — BP 104/68 | HR 77 | Ht 67.0 in | Wt 108.8 lb

## 2024-04-24 DIAGNOSIS — K219 Gastro-esophageal reflux disease without esophagitis: Secondary | ICD-10-CM | POA: Diagnosis not present

## 2024-04-24 DIAGNOSIS — R079 Chest pain, unspecified: Secondary | ICD-10-CM | POA: Diagnosis not present

## 2024-04-24 MED ORDER — MELOXICAM 7.5 MG PO TABS: 7.5000 mg | ORAL_TABLET | Freq: Every day | ORAL | 2 refills | Status: DC

## 2024-04-24 MED ORDER — FAMOTIDINE 40 MG PO TABS: 40.0000 mg | ORAL_TABLET | Freq: Every day | ORAL | 0 refills | Status: AC

## 2024-04-26 ENCOUNTER — Encounter: Payer: Self-pay | Admitting: Family Medicine

## 2024-04-26 NOTE — Progress Notes (Signed)
 Established Patient Office Visit  Subjective    Patient ID: Alan Tran, male    DOB: 2006/04/20  Age: 18 y.o. MRN: [id number]  CC:  Chief Complaint  Patient presents with   Medical Management of Chronic Issues    HPI Legend Alan Tran presents for follow up of GERD. Reports improvement of sx with present management but does not prefer to have to take meds on a daily basis. Also reports some chest wall tenderness. Denies known trauma or injury.   Outpatient Encounter Medications as of 04/24/2024  Medication Sig   [EXPIRED] doxycycline  (VIBRA -TABS) 100 MG tablet Take 1 tablet (100 mg total) by mouth 2 (two) times daily for 7 days.   famotidine (PEPCID) 40 MG tablet Take 1 tablet (40 mg total) by mouth daily.   meloxicam (MOBIC) 7.5 MG tablet Take 1 tablet (7.5 mg total) by mouth daily.   omeprazole  (PRILOSEC) 20 MG capsule Take 1 capsule (20 mg total) by mouth daily.   No facility-administered encounter medications on file as of 04/24/2024.    No past medical history on file.  No past surgical history on file.  Family History  Problem Relation Age of Onset   Healthy Mother    Healthy Brother     Social History   Socioeconomic History   Marital status: Single    Spouse name: Not on file   Number of children: Not on file   Years of education: Not on file   Highest education level: 12th grade  Occupational History   Not on file  Tobacco Use   Smoking status: Never   Smokeless tobacco: Never  Vaping Use   Vaping status: Former  Substance and Sexual Activity   Alcohol use: No   Drug use: Yes    Types: Marijuana   Sexual activity: Yes    Birth control/protection: None  Other Topics Concern   Not on file  Social History Narrative   Not on file   Social Drivers of Health   Financial Resource Strain: Low Risk  (12/08/2023)   Overall Financial Resource Strain (CARDIA)    Difficulty of Paying Living Expenses: Not hard at all  Food Insecurity: Patient Declined  (12/08/2023)   Hunger Vital Sign    Worried About Running Out of Food in the Last Year: Patient declined    Ran Out of Food in the Last Year: Patient declined  Transportation Needs: No Transportation Needs (12/08/2023)   PRAPARE - Administrator, Civil Service (Medical): No    Lack of Transportation (Non-Medical): No  Physical Activity: Insufficiently Active (12/08/2023)   Exercise Vital Sign    Days of Exercise per Week: 2 days    Minutes of Exercise per Session: 20 min  Stress: No Stress Concern Present (12/08/2023)   [name]    Feeling of Stress : Only a little  Social Connections: Unknown (12/08/2023)   Social Connection and Isolation Panel [NHANES]    Frequency of Communication with Friends and Family: More than three times a week    Frequency of Social Gatherings with Friends and Family: Three times a week    Attends Religious Services: 1 to 4 times per year    Active Member of Clubs or Organizations: No    Attends Banker Meetings: Not on file    Marital Status: Patient declined  Intimate Partner Violence: Not on file    Review of Systems  All other systems  reviewed and are negative.       Objective    BP 104/68 (BP Location: Left Arm, Patient Position: Sitting, Cuff Size: Normal)   Pulse 77   Ht 5\' 7"  (1.702 m)   Wt 108 lb 12.8 oz (49.4 kg)   SpO2 94%   BMI 17.04 kg/m   Physical Exam Vitals and nursing note reviewed.  Constitutional:      General: He is not in acute distress. Cardiovascular:     Rate and Rhythm: Normal rate and regular rhythm.  Pulmonary:     Effort: Pulmonary effort is normal.     Breath sounds: Normal breath sounds.  Chest:     Chest wall: Tenderness present.  Abdominal:     Palpations: Abdomen is soft.     Tenderness: There is no abdominal tenderness.  Neurological:     General: No focal deficit present.     Mental Status: He is alert and  oriented to person, place, and time.         Assessment & Plan:   Gastroesophageal reflux disease without esophagitis  Chest pain, unspecified type  Other orders -     Famotidine; Take 1 tablet (40 mg total) by mouth daily.  Dispense: 90 tablet; Refill: 0 -     Meloxicam; Take 1 tablet (7.5 mg total) by mouth daily.  Dispense: 30 tablet; Refill: 2     No follow-ups on file.   Arlo Lama, MD

## 2024-05-14 ENCOUNTER — Ambulatory Visit (HOSPITAL_COMMUNITY): Payer: Self-pay

## 2024-05-15 ENCOUNTER — Ambulatory Visit (HOSPITAL_COMMUNITY)
Admission: RE | Admit: 2024-05-15 | Discharge: 2024-05-15 | Disposition: A | Discharge status: Home / Self Care | Source: Ambulatory Visit | Attending: Family Medicine | Admitting: Family Medicine

## 2024-05-15 ENCOUNTER — Encounter (HOSPITAL_COMMUNITY): Payer: Self-pay

## 2024-05-15 VITALS — BP 111/77 | HR 104 | Temp 98.3°F | Resp 18

## 2024-05-15 DIAGNOSIS — Z113 Encounter for screening for infections with a predominantly sexual mode of transmission: Secondary | ICD-10-CM | POA: Insufficient documentation

## 2024-05-15 NOTE — ED Triage Notes (Signed)
 Pt states that he would like STI testing, he was tested last month and would like to confirm he is neg now. States he took all meds and waited 7 days after meds finished. Denies any current sx.

## 2024-05-15 NOTE — ED Provider Notes (Signed)
 [facility]   [id number] 05/15/24 Arrival Time: 0948  ASSESSMENT & PLAN:  1. Screening for STDs (sexually transmitted diseases)      Discharge Instructions      We have sent screening for sexually transmitted infections. We will notify you of any positive results once they are received. If required, we will prescribe any medications you might need.    Pending: Labs Reviewed  CYTOLOGY, (ORAL, ANAL, URETHRAL) ANCILLARY ONLY    Reviewed expectations re: course of current medical issues. Questions answered. Outlined signs and symptoms indicating need for more acute intervention. Patient verbalized understanding. After Visit Summary given.   SUBJECTIVE:  Alan Tran is a 18 y.o. male who states that he would like STI testing, he was tested last month and would like to confirm he is neg now. States he took all meds and waited 7 days after meds finished. Denies any current sx.   OBJECTIVE:  Vitals:   05/15/24 0957  BP: 111/77  Pulse: (!) 104  Resp: 18  Temp: 98.3 F (36.8 C)  TempSrc: Oral  SpO2: 98%     General appearance: alert, cooperative, appears stated age and no distress Psychological: alert and cooperative; normal mood and affect.  Results for orders placed or performed during the hospital encounter of 04/16/24  Cytology Ancillary Only -   Collection Time: 04/16/24  1:20 PM  Result Value Ref Range   Neisseria Gonorrhea Negative    Chlamydia Positive (A)    Trichomonas Negative    Comment Normal Reference Range Trichomonas - Negative    Comment Normal Reference Ranger Chlamydia - Negative    Comment      Normal Reference Range Neisseria Gonorrhea - Negative    Labs Reviewed  CYTOLOGY, (ORAL, ANAL, URETHRAL) ANCILLARY ONLY    No Known Allergies  History reviewed. No pertinent past medical history. Family History  Problem Relation Age of Onset   Healthy Mother    Healthy Brother    Social History   Socioeconomic History    Marital status: Single    Spouse name: Not on file   Number of children: Not on file   Years of education: Not on file   Highest education level: 12th grade  Occupational History   Not on file  Tobacco Use   Smoking status: Never   Smokeless tobacco: Never  Vaping Use   Vaping status: Former  Substance and Sexual Activity   Alcohol use: No   Drug use: Yes    Types: Marijuana   Sexual activity: Yes    Birth control/protection: None  Other Topics Concern   Not on file  Social History Narrative   Not on file   Social Drivers of Health   Financial Resource Strain: Low Risk  (12/08/2023)   Overall Financial Resource Strain (CARDIA)    Difficulty of Paying Living Expenses: Not hard at all  Food Insecurity: Patient Declined (12/08/2023)   Hunger Vital Sign    Worried About Running Out of Food in the Last Year: Patient declined    Ran Out of Food in the Last Year: Patient declined  Transportation Needs: No Transportation Needs (12/08/2023)   PRAPARE - Administrator, Civil Service (Medical): No    Lack of Transportation (Non-Medical): No  Physical Activity: Insufficiently Active (12/08/2023)   Exercise Vital Sign    Days of Exercise per Week: 2 days    Minutes of Exercise per Session: 20 min  Stress: No Stress Concern Present (12/08/2023)  [name]    Feeling of Stress : Only a little  Social Connections: Unknown (12/08/2023)   Social Connection and Isolation Panel [NHANES]    Frequency of Communication with Friends and Family: More than three times a week    Frequency of Social Gatherings with Friends and Family: Three times a week    Attends Religious Services: 1 to 4 times per year    Active Member of Clubs or Organizations: No    Attends Banker Meetings: Not on file    Marital Status: Patient declined  Intimate Partner Violence: Not on file           Afton Albright,  MD 05/15/24 1116

## 2024-05-15 NOTE — Discharge Instructions (Addendum)
 We have sent screening for sexually transmitted infections. We will notify you of any positive results once they are received. If required, we will prescribe any medications you might need.

## 2024-05-16 LAB — CYTOLOGY, (ORAL, ANAL, URETHRAL) ANCILLARY ONLY
Chlamydia: NEGATIVE
Comment: NEGATIVE
Comment: NEGATIVE
Comment: NORMAL
Neisseria Gonorrhea: NEGATIVE
Trichomonas: NEGATIVE

## 2024-07-05 ENCOUNTER — Ambulatory Visit (INDEPENDENT_AMBULATORY_CARE_PROVIDER_SITE_OTHER): Admitting: Family Medicine

## 2024-07-05 VITALS — BP 114/57 | HR 70 | Ht 67.0 in | Wt 107.2 lb

## 2024-07-05 DIAGNOSIS — R0789 Other chest pain: Secondary | ICD-10-CM

## 2024-07-05 MED ORDER — MELOXICAM 7.5 MG PO TABS: 7.5000 mg | ORAL_TABLET | Freq: Every day | ORAL | 2 refills | Status: AC

## 2024-07-05 MED ORDER — CYCLOBENZAPRINE HCL 10 MG PO TABS: 10.0000 mg | ORAL_TABLET | Freq: Three times a day (TID) | ORAL | 2 refills | Status: AC | PRN

## 2024-07-06 ENCOUNTER — Encounter: Payer: Self-pay | Admitting: Family Medicine

## 2024-07-06 NOTE — Progress Notes (Signed)
 Established Patient Office Visit  Subjective    Patient ID: Alan Tran, male    DOB: 07-Jun-2006  Age: 18 y.o. MRN: [id number]  CC:  Chief Complaint  Patient presents with   Annual Exam    HPI Alan Tran presents with complaint of chest wall pain. He reports that he is able to touch the area and that he feels discomfort intermittently. He denies known trauma or injury. He is taking no meds for sx.   Outpatient Encounter Medications as of 07/05/2024  Medication Sig   cyclobenzaprine  (FLEXERIL ) 10 MG tablet Take 1 tablet (10 mg total) by mouth 3 (three) times daily as needed for muscle spasms.   famotidine  (PEPCID ) 40 MG tablet Take 1 tablet (40 mg total) by mouth daily. (Patient not taking: Reported on 07/05/2024)   meloxicam  (MOBIC ) 7.5 MG tablet Take 1 tablet (7.5 mg total) by mouth daily.   omeprazole  (PRILOSEC) 20 MG capsule Take 1 capsule (20 mg total) by mouth daily. (Patient not taking: Reported on 07/05/2024)   [DISCONTINUED] meloxicam  (MOBIC ) 7.5 MG tablet Take 1 tablet (7.5 mg total) by mouth daily. (Patient not taking: Reported on 07/05/2024)   No facility-administered encounter medications on file as of 07/05/2024.    History reviewed. No pertinent past medical history.  History reviewed. No pertinent surgical history.  Family History  Problem Relation Age of Onset   Healthy Mother    Healthy Brother     Social History   Socioeconomic History   Marital status: Single    Spouse name: Not on file   Number of children: Not on file   Years of education: Not on file   Highest education level: 12th grade  Occupational History   Not on file  Tobacco Use   Smoking status: Never   Smokeless tobacco: Never  Vaping Use   Vaping status: Former  Substance and Sexual Activity   Alcohol use: No   Drug use: Yes    Types: Marijuana   Sexual activity: Yes    Birth control/protection: None  Other Topics Concern   Not on file  Social History Narrative   Not on  file   Social Drivers of Health   Financial Resource Strain: Low Risk  (07/04/2024)   Overall Financial Resource Strain (CARDIA)    Difficulty of Paying Living Expenses: Not hard at all  Food Insecurity: No Food Insecurity (07/04/2024)   Hunger Vital Sign    Worried About Running Out of Food in the Last Year: Never true    Ran Out of Food in the Last Year: Never true  Transportation Needs: No Transportation Needs (07/04/2024)   PRAPARE - Administrator, Civil Service (Medical): No    Lack of Transportation (Non-Medical): No  Physical Activity: Insufficiently Active (07/04/2024)   Exercise Vital Sign    Days of Exercise per Week: 2 days    Minutes of Exercise per Session: 20 min  Stress: No Stress Concern Present (07/04/2024)   [name]    Feeling of Stress: Only a little  Social Connections: Moderately Isolated (07/04/2024)   Social Connection and Isolation Panel    Frequency of Communication with Friends and Family: More than three times a week    Frequency of Social Gatherings with Friends and Family: Three times a week    Attends Religious Services: 1 to 4 times per year    Active Member of Clubs or Organizations: No  Attends Banker Meetings: Not on file    Marital Status: Never married  Intimate Partner Violence: Not on file    Review of Systems  All other systems reviewed and are negative.       Objective    BP (!) 114/57   Pulse 70   Ht 5' 7 (1.702 m)   Wt 107 lb 3.2 oz (48.6 kg)   SpO2 96%   BMI 16.79 kg/m   Physical Exam Vitals and nursing note reviewed.  Constitutional:      General: He is not in acute distress. Cardiovascular:     Rate and Rhythm: Normal rate and regular rhythm.  Pulmonary:     Effort: Pulmonary effort is normal.     Breath sounds: Normal breath sounds.  Chest:     Chest wall: Tenderness (leftsided) present. No mass or deformity.   Neurological:     General: No focal deficit present.     Mental Status: He is alert and oriented to person, place, and time.         Assessment & Plan:   Chest wall pain  Other orders -     Cyclobenzaprine  HCl; Take 1 tablet (10 mg total) by mouth 3 (three) times daily as needed for muscle spasms.  Dispense: 30 tablet; Refill: 2 -     Meloxicam ; Take 1 tablet (7.5 mg total) by mouth daily.  Dispense: 30 tablet; Refill: 2     Return if symptoms worsen or fail to improve.   Tanda Raguel SQUIBB, MD

## 2024-12-25 ENCOUNTER — Ambulatory Visit (HOSPITAL_COMMUNITY)
Admission: EM | Admit: 2024-12-25 | Discharge: 2024-12-25 | Disposition: A | Discharge status: Home / Self Care | Attending: Family Medicine | Admitting: Family Medicine

## 2024-12-25 ENCOUNTER — Encounter (HOSPITAL_COMMUNITY): Payer: Self-pay

## 2024-12-25 DIAGNOSIS — Z113 Encounter for screening for infections with a predominantly sexual mode of transmission: Secondary | ICD-10-CM | POA: Diagnosis present

## 2024-12-25 NOTE — Discharge Instructions (Addendum)
 If any of your tests today are positive, you will get a call from us . These test usually take 1-2 days to result. If you view your test results in mychart and have not received a call from us , please call to ask what to do.

## 2024-12-25 NOTE — ED Provider Notes (Signed)
 " [facility]    CSN: [id number] Arrival date & time: 12/25/24  1557      History   Chief Complaint Chief Complaint  Patient presents with   possible exposure to STD    HPI Alan Tran is a 19 y.o. male.   - concern for STI - no current symptoms - new sexual partner - last intercourse days ago - no discharge - no dysuria - patient reluctant to share further details regarding sexual history  The history is provided by the patient.    History reviewed. No pertinent past medical history.  Patient Active Problem List   Diagnosis Date Noted   Acute pain of right knee 10/13/2016   Encounter for well child check without abnormal findings 01/30/2016    History reviewed. No pertinent surgical history.     Home Medications    Prior to Admission medications  Medication Sig Start Date End Date Taking? Authorizing Provider  cyclobenzaprine  (FLEXERIL ) 10 MG tablet Take 1 tablet (10 mg total) by mouth 3 (three) times daily as needed for muscle spasms. Patient not taking: Reported on 12/25/2024 07/05/24   Tanda Bleacher, MD  famotidine  (PEPCID ) 40 MG tablet Take 1 tablet (40 mg total) by mouth daily. Patient not taking: Reported on 07/05/2024 04/24/24   Tanda Bleacher, MD  meloxicam  (MOBIC ) 7.5 MG tablet Take 1 tablet (7.5 mg total) by mouth daily. Patient not taking: Reported on 12/25/2024 07/05/24   Tanda Bleacher, MD  omeprazole  (PRILOSEC) 20 MG capsule Take 1 capsule (20 mg total) by mouth daily. Patient not taking: Reported on 07/05/2024 12/22/23 03/21/24  Jaycee Greig PARAS, NP    Family History Family History  Problem Relation Age of Onset   Healthy Mother    Healthy Brother     Social History Social History[1]   Allergies   Patient has no known allergies.   Review of Systems Review of Systems  Constitutional:  Negative for chills.  Genitourinary:  Negative for decreased urine volume, hematuria, penile discharge, penile pain, penile swelling, scrotal  swelling, testicular pain and urgency.     Physical Exam Triage Vital Signs ED Triage Vitals  Encounter Vitals Group     BP 12/25/24 1605 108/70     Girls Systolic BP Percentile --      Girls Diastolic BP Percentile --      Boys Systolic BP Percentile --      Boys Diastolic BP Percentile --      Pulse Rate 12/25/24 1605 87     Resp 12/25/24 1605 14     Temp 12/25/24 1605 98.6 F (37 C)     Temp Source 12/25/24 1605 Oral     SpO2 12/25/24 1605 97 %     Weight --      Height --      Head Circumference --      Peak Flow --      Pain Score 12/25/24 1607 0     Pain Loc --      Pain Education --      Exclude from Growth Chart --    No data found.  Updated Vital Signs BP 108/70 (BP Location: Left Arm)   Pulse 87   Temp 98.6 F (37 C) (Oral)   Resp 14   SpO2 97%   Visual Acuity Right Eye Distance:   Left Eye Distance:   Bilateral Distance:    Right Eye Near:   Left Eye Near:    Bilateral Near:  Physical Exam Vitals and nursing note reviewed.  Constitutional:      General: He is not in acute distress.    Appearance: He is well-developed.  HENT:     Head: Normocephalic and atraumatic.  Eyes:     Conjunctiva/sclera: Conjunctivae normal.  Cardiovascular:     Rate and Rhythm: Normal rate and regular rhythm.     Heart sounds: No murmur heard. Pulmonary:     Effort: Pulmonary effort is normal. No respiratory distress.     Breath sounds: Normal breath sounds.  Abdominal:     Palpations: Abdomen is soft.     Tenderness: There is no abdominal tenderness.  Musculoskeletal:        General: No swelling.     Cervical back: Neck supple.  Skin:    General: Skin is warm and dry.     Capillary Refill: Capillary refill takes less than 2 seconds.  Neurological:     Mental Status: He is alert.  Psychiatric:        Mood and Affect: Mood normal.      UC Treatments / Results  Labs (all labs ordered are listed, but only abnormal results are displayed) Labs Reviewed   CYTOLOGY, (ORAL, ANAL, URETHRAL) ANCILLARY ONLY    EKG   Radiology No results found.  Procedures Procedures (including critical care time)  Medications Ordered in UC Medications - No data to display  Initial Impression / Assessment and Plan / UC Course  I have reviewed the triage vital signs and the nursing notes.  Pertinent labs & imaging results that were available during my care of the patient were reviewed by me and considered in my medical decision making (see chart for details).    Patient here today for routine STI screening.  Offered HIV and syphilis testing however patient declined.  Urine cytology pending for gonorrhea chlamydia, trichomonas.  Reassuringly no active symptoms.  Will treat as indicated.  Final Clinical Impressions(s) / UC Diagnoses   Final diagnoses:  Routine screening for STI (sexually transmitted infection)     Discharge Instructions      If any of your tests today are positive, you will get a call from us . These test usually take 1-2 days to result. If you view your test results in mychart and have not received a call from us , please call to ask what to do.     ED Prescriptions   None    PDMP not reviewed this encounter.     [1]  Social History Tobacco Use   Smoking status: Never   Smokeless tobacco: Never  Vaping Use   Vaping status: Former  Substance Use Topics   Alcohol use: No   Drug use: Yes    Types: Marijuana     Alba Sharper, MD 12/25/24 1711  "

## 2024-12-25 NOTE — ED Triage Notes (Signed)
 Patient reports that his sexual partner said that they might have an STD. Patient denies any symptoms.

## 2024-12-26 LAB — CYTOLOGY, (ORAL, ANAL, URETHRAL) ANCILLARY ONLY
Chlamydia: NEGATIVE
Comment: NEGATIVE
Comment: NEGATIVE
Comment: NORMAL
Neisseria Gonorrhea: NEGATIVE
Trichomonas: POSITIVE — AB

## 2024-12-27 ENCOUNTER — Ambulatory Visit (HOSPITAL_COMMUNITY): Payer: Self-pay

## 2024-12-27 MED ORDER — METRONIDAZOLE 500 MG PO TABS: 2000.0000 mg | ORAL_TABLET | Freq: Once | ORAL | 0 refills | Status: AC

## 2024-12-31 ENCOUNTER — Ambulatory Visit: Payer: Self-pay | Admitting: Family

## 2024-12-31 ENCOUNTER — Ambulatory Visit: Admitting: Family

## 2024-12-31 ENCOUNTER — Encounter: Payer: Self-pay | Admitting: Family

## 2024-12-31 VITALS — BP 126/70 | HR 100 | Ht 67.0 in | Wt 108.0 lb

## 2024-12-31 DIAGNOSIS — Z114 Encounter for screening for human immunodeficiency virus [HIV]: Secondary | ICD-10-CM

## 2024-12-31 DIAGNOSIS — N50819 Testicular pain, unspecified: Secondary | ICD-10-CM | POA: Diagnosis not present

## 2024-12-31 LAB — POCT URINALYSIS DIP (CLINITEK)
Bilirubin, UA: NEGATIVE
Blood, UA: NEGATIVE
Glucose, UA: NEGATIVE mg/dL
Ketones, POC UA: NEGATIVE mg/dL
LEUKOCYTES: NEGATIVE
Nitrite, UA: NEGATIVE
POC PROTEIN,UA: NEGATIVE
Spec Grav, UA: 1.015
Urobilinogen, UA: NEGATIVE U/dL — AB
pH, UA: 7

## 2024-12-31 NOTE — Progress Notes (Signed)
 "   Patient ID: Alan Tran, male    DOB: 2006/01/15  MRN: [id number]  CC: Testicular Pain  Subjective: Alan Tran is a 19 y.o. male who presents for testicular pain.   His concerns today include:  Patient seen on 12/25/2024 (1 hours) at [facility] for routine screening for STI (sexually transmitted infection). Patient confirms he tested positive for trichomonas and was prescribed medication of which he has already completed. Today patient reports he has testicular pain bilaterally. Denies red flag symptoms.   Patient Active Problem List   Diagnosis Date Noted   Acute pain of right knee 10/13/2016   Encounter for well child check without abnormal findings 01/30/2016     Medications Ordered Prior to Encounter[1]  Allergies[2]  Social History   Socioeconomic History   Marital status: Single    Spouse name: Not on file   Number of children: Not on file   Years of education: Not on file   Highest education level: 12th grade  Occupational History   Not on file  Tobacco Use   Smoking status: Never   Smokeless tobacco: Never  Vaping Use   Vaping status: Former  Substance and Sexual Activity   Alcohol use: No   Drug use: Yes    Types: Marijuana   Sexual activity: Yes    Birth control/protection: None  Other Topics Concern   Not on file  Social History Narrative   Not on file   Social Drivers of Health   Tobacco Use: Low Risk (12/25/2024)   Patient History    Smoking Tobacco Use: Never    Smokeless Tobacco Use: Never    Passive Exposure: Not on file  Financial Resource Strain: Low Risk (07/04/2024)   Overall Financial Resource Strain (CARDIA)    Difficulty of Paying Living Expenses: Not hard at all  Food Insecurity: No Food Insecurity (07/04/2024)   Epic    Worried About Programme Researcher, Broadcasting/film/video in the Last Year: Never true    Ran Out of Food in the Last Year: Never true  Transportation Needs: No Transportation Needs (07/04/2024)   Epic     Lack of Transportation (Medical): No    Lack of Transportation (Non-Medical): No  Physical Activity: Insufficiently Active (07/04/2024)   Exercise Vital Sign    Days of Exercise per Week: 2 days    Minutes of Exercise per Session: 20 min  Stress: No Stress Concern Present (07/04/2024)   [name]    Feeling of Stress: Only a little  Social Connections: Moderately Isolated (07/04/2024)   Social Connection and Isolation Panel    Frequency of Communication with Friends and Family: More than three times a week    Frequency of Social Gatherings with Friends and Family: Three times a week    Attends Religious Services: 1 to 4 times per year    Active Member of Clubs or Organizations: No    Attends Banker Meetings: Not on file    Marital Status: Never married  Intimate Partner Violence: Not on file  Depression (PHQ2-9): Low Risk (04/24/2024)   Depression (PHQ2-9)    PHQ-2 Score: 0  Alcohol Screen: Low Risk (12/08/2023)   Alcohol Screen    Last Alcohol Screening Score (AUDIT): 0  Housing: Unknown (07/04/2024)   Epic    Unable to Pay for Housing in the Last Year: No    Number of Times Moved in the Last Year: 0  Homeless in the Last Year: Not on file  Utilities: Not on file  Health Literacy: Not on file    Family History  Problem Relation Age of Onset   Healthy Mother    Healthy Brother     History reviewed. No pertinent surgical history.  ROS: Review of Systems Negative except as stated above  PHYSICAL EXAM: BP 126/70   Pulse 100   Ht 5' 7 (1.702 m)   Wt 108 lb (49 kg)   SpO2 97%   BMI 16.92 kg/m   Physical Exam HENT:     Head: Normocephalic and atraumatic.     Nose: Nose normal.     Mouth/Throat:     Mouth: Mucous membranes are moist.     Pharynx: Oropharynx is clear.  Eyes:     Extraocular Movements: Extraocular movements intact.     Conjunctiva/sclera: Conjunctivae normal.      Pupils: Pupils are equal, round, and reactive to light.  Cardiovascular:     Rate and Rhythm: Normal rate and regular rhythm.     Pulses: Normal pulses.     Heart sounds: Normal heart sounds.  Pulmonary:     Effort: Pulmonary effort is normal.     Breath sounds: Normal breath sounds.  Genitourinary:    Penis: Normal.      Testes: Normal.     Comments: Therisa Mediate, CMA present. Musculoskeletal:        General: Normal range of motion.     Cervical back: Normal range of motion and neck supple.  Neurological:     General: No focal deficit present.     Mental Status: He is alert and oriented to person, place, and time.  Psychiatric:        Mood and Affect: Mood normal.        Behavior: Behavior normal.    ASSESSMENT AND PLAN: 1. Pain in testicle, unspecified laterality (Primary) - US  Scrotum W/ Doppler for evaluation.  - Routine screening.  - Follow-up with primary provider as scheduled.  - POCT URINALYSIS DIP (CLINITEK); Future - Urine Culture - US  SCROTUM W/DOPPLER; Future  2. Encounter for screening for HIV - Routine screening.  - HIV antibody (with reflex)   Patient was given the opportunity to ask questions.  Patient verbalized understanding of the plan and was able to repeat key elements of the plan. Patient was given clear instructions to go to Emergency Department or return to medical center if symptoms don't improve, worsen, or new problems develop.The patient verbalized understanding.   Orders Placed This Encounter  Procedures   Urine Culture   US  Scrotum   HIV antibody (with reflex)   POCT URINALYSIS DIP (CLINITEK)    Return for Follow-Up or next available with Raguel Blush, MD.  Greig JINNY Chute, NP      [1]  Current Outpatient Medications on File Prior to Visit  Medication Sig Dispense Refill   cyclobenzaprine  (FLEXERIL ) 10 MG tablet Take 1 tablet (10 mg total) by mouth 3 (three) times daily as needed for muscle spasms. (Patient not taking: Reported on  12/25/2024) 30 tablet 2   famotidine  (PEPCID ) 40 MG tablet Take 1 tablet (40 mg total) by mouth daily. (Patient not taking: Reported on 07/05/2024) 90 tablet 0   meloxicam  (MOBIC ) 7.5 MG tablet Take 1 tablet (7.5 mg total) by mouth daily. (Patient not taking: Reported on 12/25/2024) 30 tablet 2   omeprazole  (PRILOSEC) 20 MG capsule Take 1 capsule (20 mg total) by mouth daily. (Patient not  taking: Reported on 07/05/2024) 90 capsule 0   No current facility-administered medications on file prior to visit.  [2] No Known Allergies  "

## 2025-01-01 LAB — HIV ANTIBODY (ROUTINE TESTING W REFLEX): HIV Screen 4th Generation wRfx: NONREACTIVE

## 2025-01-01 NOTE — Telephone Encounter (Signed)
 Last read by Jannette LOISE Mines at 7:54AM on 01/01/2025.

## 2025-01-03 ENCOUNTER — Ambulatory Visit
Admission: RE | Admit: 2025-01-03 | Discharge: 2025-01-03 | Disposition: A | Source: Ambulatory Visit | Attending: Family | Admitting: Family

## 2025-01-03 DIAGNOSIS — N50819 Testicular pain, unspecified: Secondary | ICD-10-CM

## 2025-01-03 LAB — URINE CULTURE: Organism ID, Bacteria: NO GROWTH

## 2025-01-09 ENCOUNTER — Other Ambulatory Visit

## 2025-01-22 ENCOUNTER — Encounter: Admitting: Family

## 2025-01-22 NOTE — Progress Notes (Signed)
 Erroneous encounter-disregard

## 2025-01-23 ENCOUNTER — Encounter: Admitting: Family

## 2025-01-23 NOTE — Progress Notes (Signed)
 Erroneous encounter-disregard
